# Patient Record
Sex: Male | Born: 1975 | Race: White | Hispanic: No | Marital: Married | Smoking: Never smoker
Health system: Southern US, Community
[De-identification: ages and names within clinical notes are randomized; demographics above are authoritative.]

## PROBLEM LIST (undated history)

## (undated) HISTORY — PX: VASECTOMY: SHX75

---

## 2009-10-13 DEATH — deceased

## 2017-01-31 DIAGNOSIS — S56911A Strain of unspecified muscles, fascia and tendons at forearm level, right arm, initial encounter: Secondary | ICD-10-CM | POA: Diagnosis not present

## 2017-01-31 DIAGNOSIS — I861 Scrotal varices: Secondary | ICD-10-CM | POA: Diagnosis not present

## 2017-08-13 DIAGNOSIS — Z13220 Encounter for screening for lipoid disorders: Secondary | ICD-10-CM | POA: Diagnosis not present

## 2017-08-13 DIAGNOSIS — Z0000 Encounter for general adult medical examination without abnormal findings: Secondary | ICD-10-CM | POA: Diagnosis not present

## 2019-03-20 ENCOUNTER — Ambulatory Visit: Payer: Self-pay | Admitting: Family

## 2019-03-30 ENCOUNTER — Ambulatory Visit: Payer: Self-pay | Admitting: Family

## 2019-04-17 ENCOUNTER — Ambulatory Visit (INDEPENDENT_AMBULATORY_CARE_PROVIDER_SITE_OTHER): Payer: 59 | Admitting: Family

## 2019-04-17 ENCOUNTER — Encounter: Payer: Self-pay | Admitting: Family

## 2019-04-17 ENCOUNTER — Other Ambulatory Visit: Payer: Self-pay

## 2019-04-17 ENCOUNTER — Other Ambulatory Visit: Payer: Self-pay | Admitting: Family

## 2019-04-17 ENCOUNTER — Encounter (INDEPENDENT_AMBULATORY_CARE_PROVIDER_SITE_OTHER): Payer: Self-pay

## 2019-04-17 VITALS — BP 116/72 | HR 59 | Temp 98.0°F | Ht 73.0 in | Wt 201.6 lb

## 2019-04-17 DIAGNOSIS — Z125 Encounter for screening for malignant neoplasm of prostate: Secondary | ICD-10-CM

## 2019-04-17 DIAGNOSIS — M25561 Pain in right knee: Secondary | ICD-10-CM | POA: Diagnosis not present

## 2019-04-17 DIAGNOSIS — Z13220 Encounter for screening for lipoid disorders: Secondary | ICD-10-CM | POA: Diagnosis not present

## 2019-04-17 DIAGNOSIS — Z0000 Encounter for general adult medical examination without abnormal findings: Secondary | ICD-10-CM | POA: Diagnosis not present

## 2019-04-17 DIAGNOSIS — M79672 Pain in left foot: Secondary | ICD-10-CM

## 2019-04-17 LAB — COMPREHENSIVE METABOLIC PANEL
ALT: 17 U/L (ref 0–53)
AST: 16 U/L (ref 0–37)
Albumin: 4.1 g/dL (ref 3.5–5.2)
Alkaline Phosphatase: 61 U/L (ref 39–117)
BUN: 15 mg/dL (ref 6–23)
CO2: 27 mEq/L (ref 19–32)
Calcium: 9.3 mg/dL (ref 8.4–10.5)
Chloride: 104 mEq/L (ref 96–112)
Creatinine, Ser: 0.97 mg/dL (ref 0.40–1.50)
GFR: 83.67 mL/min (ref >60.00)
Glucose, Bld: 74 mg/dL (ref 70–99)
Potassium: 3.9 mEq/L (ref 3.5–5.1)
Sodium: 138 mEq/L (ref 135–145)
Total Bilirubin: 0.7 mg/dL (ref 0.2–1.2)
Total Protein: 6.6 g/dL (ref 6.0–8.3)

## 2019-04-17 LAB — PSA: PSA: 0.36 ng/mL (ref 0.10–4.00)

## 2019-04-17 LAB — CBC WITH DIFFERENTIAL/PLATELET
Basophils Absolute: 0 10*3/uL (ref 0.0–0.1)
Basophils Relative: 0.4 % (ref 0.0–3.0)
Eosinophils Absolute: 0 10*3/uL (ref 0.0–0.7)
Eosinophils Relative: 0.6 % (ref 0.0–5.0)
HCT: 45 % (ref 39.0–52.0)
Hemoglobin: 15.2 g/dL (ref 13.0–17.0)
Lymphocytes Relative: 15.3 % (ref 12.0–46.0)
Lymphs Abs: 1.1 10*3/uL (ref 0.7–4.0)
MCHC: 33.7 g/dL (ref 30.0–36.0)
MCV: 92.3 fl (ref 78.0–100.0)
Monocytes Absolute: 0.5 10*3/uL (ref 0.1–1.0)
Monocytes Relative: 6.7 % (ref 3.0–12.0)
Neutro Abs: 5.7 10*3/uL (ref 1.4–7.7)
Neutrophils Relative %: 77 % (ref 43.0–77.0)
Platelets: 183 10*3/uL (ref 150.0–400.0)
RBC: 4.87 Mil/uL (ref 4.22–5.81)
RDW: 12.6 % (ref 11.5–15.5)
WBC: 7.4 10*3/uL (ref 4.0–10.5)

## 2019-04-17 LAB — LIPID PANEL
Cholesterol: 176 mg/dL (ref 0–200)
HDL: 38.7 mg/dL — ABNORMAL LOW (ref >39.00)
LDL Cholesterol: 117 mg/dL — ABNORMAL HIGH (ref 0–99)
NonHDL: 136.87
Total CHOL/HDL Ratio: 5
Triglycerides: 97 mg/dL (ref 0.0–149.0)
VLDL: 19.4 mg/dL (ref 0.0–40.0)

## 2019-04-17 NOTE — Progress Notes (Signed)
Scott [name] is a 44 y.o. male with the following history as recorded in Epic[employer]:  There are no problems to display for this patient.   No current outpatient medications on file.   No current facility-administered medications for this visit.    Allergies: Patient has no known allergies.  History reviewed. No pertinent past medical history.  Past Surgical History:  Procedure Laterality Date  . VASECTOMY      Family History  Problem Relation Age of Onset  . Hyperlipidemia Father   . Hypertension Father     Social History   Tobacco Use  . Smoking status: Never Smoker  . Smokeless tobacco: Never Used  Substance Use Topics  . Alcohol use: Not on file    Subjective:  Patient presents today as a new patient; 1) Right knee pain "on and off" x 1 year; feels like it has been more problematic since trying to start exercising in the past year;  2) Suspicious for left foot plantar fasciitis;  Needs to get yearly labs/ CPE updated for employer; in baseline state of health today; Does see dentist regularly and overdue to see eye doctor; Sleeping well;  Tdap up to date;  Health Maintenance  Topic Date Due  . HIV Screening  03/19/1989  . TETANUS/TDAP  07/27/2025  . INFLUENZA VACCINE  Completed    Review of Systems  Constitutional: Positive for weight loss.       Planned with increased exercise  HENT: Negative.   Eyes: Negative.   Respiratory: Negative.   Cardiovascular: Negative.   Gastrointestinal: Negative.   Genitourinary: Negative.   Musculoskeletal: Positive for joint pain.  Neurological: Negative.   Endo/Heme/Allergies: Negative.   Psychiatric/Behavioral: Negative.       Objective:  Vitals:   04/17/19 1137  BP: 116/72  Pulse: (!) 59  Temp: 98 F (36.7 C)  TempSrc: Oral  SpO2: 96%  Weight: 201 lb 9.6 oz (91.4 kg)  Height: 6' 1"  (1.854 m)    General: Well developed, well nourished, in no acute distress  Skin : Warm and dry.  Head: Normocephalic and  atraumatic  Eyes: Sclera and conjunctiva clear; pupils round and reactive to light; extraocular movements intact  Ears: External normal; canals clear; tympanic membranes normal  Oropharynx: Pink, supple. No suspicious lesions  Neck: Supple without thyromegaly, adenopathy  Lungs: Respirations unlabored; clear to auscultation bilaterally without wheeze, rales, rhonchi  CVS exam: normal rate and regular rhythm.  Abdomen: Soft; nontender; nondistended; normoactive bowel sounds; no masses or hepatosplenomegaly  Musculoskeletal: No deformities; no active joint inflammation  Extremities: No edema, cyanosis, clubbing  Vessels: Symmetric bilaterally  Neurologic: Alert and oriented; speech intact; face symmetrical; moves all extremities well; CNII-XII intact without focal deficit   Assessment:  1. PE (physical exam), annual   2. Lipid screening   3. Prostate cancer screening     Plan:   Age appropriate preventive healthcare needs addressed; encouraged regular eye doctor and dental exams; encouraged regular exercise and continue working on weight loss goals; will update labs and refills as needed today; follow-up to be determined; Return for follow up with sports medicine.  This visit occurred during the SARS-CoV-2 public health emergency.  Safety protocols were in place, including screening questions prior to the visit, additional usage of staff PPE, and extensive cleaning of exam room while observing appropriate contact time as indicated for disinfecting solutions.       Return for Schedule follow-up with Dr. Tamala Julian or Dr. Georgina Snell for right knee/ left  foot pain.  Orders Placed This Encounter  Procedures  . CBC w/Diff  . Comp Met (CMET)  . Lipid panel  . PSA    Requested Prescriptions    No prescriptions requested or ordered in this encounter

## 2019-04-21 ENCOUNTER — Other Ambulatory Visit: Payer: Self-pay

## 2019-04-21 ENCOUNTER — Ambulatory Visit (INDEPENDENT_AMBULATORY_CARE_PROVIDER_SITE_OTHER): Payer: 59

## 2019-04-21 ENCOUNTER — Encounter: Payer: Self-pay | Admitting: Family Medicine

## 2019-04-21 ENCOUNTER — Ambulatory Visit (INDEPENDENT_AMBULATORY_CARE_PROVIDER_SITE_OTHER): Payer: 59 | Admitting: Family Medicine

## 2019-04-21 ENCOUNTER — Ambulatory Visit: Payer: Self-pay

## 2019-04-21 VITALS — BP 120/78 | HR 66 | Ht 73.0 in | Wt 202.0 lb

## 2019-04-21 DIAGNOSIS — M25561 Pain in right knee: Secondary | ICD-10-CM | POA: Diagnosis not present

## 2019-04-21 DIAGNOSIS — M79672 Pain in left foot: Secondary | ICD-10-CM

## 2019-04-21 MED ORDER — PENNSAID 2 % EX SOLN: 1.0000 "application " | Freq: Two times a day (BID) | CUTANEOUS | 2 refills | Status: DC

## 2019-04-21 NOTE — Progress Notes (Signed)
Subjective:    I'm seeing this patient as a consultation for:  Ria Clock, FNP. Note will be routed back to referring provider/PCP.  CC: R knee and L foot pain  I, Molly Weber, LAT, ATC, am serving as scribe for Dr. Clementeen Graham.  HPI: Pt is a 44 y/o male presenting w/ c/o R knee pain and L foot pain.  R knee pain: started last year when doing HIIT workout  -Radiating pain: pain starts at top of shin and will radiate to lateral knee and top of knee cap -R knee swelling: no -R knee mechanical symptoms: -Aggravating factors: Lunges or squats sometimes walking -Treatments tried: advil  L foot pain: started in July dull sharp pain at bottom of heel  -Radiating pain: will radiate to the back of heel  -Numbness/tingling: none -Aggravating factors: worse in AM -Treatments tried: advil  Past medical history, Surgical history, Family history, Social history, Allergies, and medications have been entered into the medical record, reviewed.   Review of Systems: No new headache, visual changes, nausea, vomiting, diarrhea, constipation, dizziness, abdominal pain, skin rash, fevers, chills, night sweats, weight loss, swollen lymph nodes, body aches, joint swelling, muscle aches, chest pain, shortness of breath, mood changes, visual or auditory hallucinations.   Objective:    Vitals:   04/21/19 1533  BP: 120/78  Pulse: 66  SpO2: 97%   General: Well Developed, well nourished, and in no acute distress.   MSK:  Right knee: Normal-appearing no effusion no deformity. Range of motion 0-120 degrees with crepitation. Not particular tender to palpation. Stable ligamentous exam. Palpable click with medial McMurray's test without pain.  Palpable click mildly at lateral McMurray's test without pain. Intact strength.  Left foot: Normal-appearing Normal foot and ankle motion. Pulses cap refill and sensation are intact distally. Tender palpation plantar calcaneus  Lab and Radiology  Results  Diagnostic Limited MSK Ultrasound of: Right knee Quad tendon normal-appearing.  Slight hyperechoic change at insertion on the patella.  Nontender to palpation with this region with ultrasound probe. Patellar tendon normal. Medial meniscus joint line slightly narrowed no definitive medial meniscus tear. Lateral meniscus: Joint line narrowed.  Hypoechoic fissure through midportion of lateral meniscus consistent with tear. 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  Diagnostic Limited MSK Ultrasound of: Left calcaneus Plantar fascia visible at medial plantar calcaneus. Measuring approximately 0.6 cm. No visible tear. Plantar calcaneal spur present. Impression: Plantar fasciitis with plantar spur  X-ray images obtained today right knee personally and independently reviewed. Mild medial compartment degenerative changes.  No acute fractures. Await for radiology review  Impression and Recommendations:    Assessment and Plan: 44 y.o. male with  Right knee pain: Mild degenerative changes.  Possible meniscus tear. Plan for Voltaren gel or Pennsaid.  Eccentric exercises recheck back if not improving.  Neck step would be injection.  Left plantar calcaneal pain.  Plantar fasciitis.  Eccentric exercises ice massage gel heel cups.  Check back if not improving.  PDMP not reviewed this encounter. Orders Placed This Encounter  Procedures  . [place] LIMITED JOINT SPACE STRUCTURES LOW BILAT(NO LINKED CHARGES)    Order Specific Question:   Reason for Exam (SYMPTOM  OR DIAGNOSIS REQUIRED)    Answer:   eval right knee and left foot pain    Order Specific Question:   Preferred imaging location?    Answer:   [facility]  . DG Knee 4 Views W/Patella Right    Standing Status:   Future  Number of Occurrences:   1    Standing Expiration Date:   06/20/2020    Order Specific Question:   Reason for Exam (SYMPTOM  OR DIAGNOSIS REQUIRED)    Answer:   eval knee pain r     Order Specific Question:   Preferred imaging location?    Answer:   [facility]    Order Specific Question:   Radiology Contrast Protocol - do NOT remove file path    Answer:   [email or web address]   Meds ordered this encounter  Medications  . Diclofenac Sodium (PENNSAID) 2 % SOLN    Sig: Place 1 application onto the skin 2 (two) times daily.    Dispense:  112 g    Refill:  2    Home Phone      [phone] Mobile          [phone]     Discussed warning signs or symptoms. Please see discharge instructions. Patient expresses understanding.   The above documentation has been reviewed and is accurate and complete Lynne Leader

## 2019-04-21 NOTE — Patient Instructions (Addendum)
Thank you for coming in today. We will try voltaren gel OTC or prescription pensaid.  Up to 4x daily for pain Get xray now on the way out.  Use body Helix full knee sleeve during and for 30-60 mins following activity.  Do the heel exercises.  Remember to go down slowly. 30 reps 2-3x daily.  Ice massage in the evening for the foot.     Patellofemoral Pain Syndrome  Patellofemoral pain syndrome is a condition in which the tissue (cartilage) on the underside of the kneecap (patella) softens or breaks down. This causes pain in the front of the knee. The condition is also called runner's knee or chondromalacia patella. Patellofemoral pain syndrome is most common in young adults who are active in sports. The knee is the largest joint in the body. The patella covers the front of the knee and is attached to muscles above and below the knee. The underside of the patella is covered with a smooth type of cartilage (synovium). The smooth surface helps the patella to glide easily when you move your knee. Patellofemoral pain syndrome causes swelling in the joint linings and bone surfaces in the knee. What are the causes? This condition may be caused by:  Overuse of the knee.  Poor alignment of your knee joints.  Weak leg muscles.  A direct blow to your kneecap. What increases the risk? You are more likely to develop this condition if:  You do a lot of activities that can wear down your kneecap. These include: ? Running. ? Squatting. ? Climbing stairs.  You start a new physical activity or exercise program.  You wear shoes that do not fit well.  You do not have good leg strength.  You are overweight. What are the signs or symptoms? The main symptom of this condition is knee pain. This may feel like a dull, aching pain underneath your patella, in the front of your knee. There may be a popping or cracking sound when you move your knee. Pain may get worse with:  Exercise.  Climbing  stairs.  Running.  Jumping.  Squatting.  Kneeling.  Sitting for a long time.  Moving or pushing on your patella. How is this diagnosed? This condition may be diagnosed based on:  Your symptoms and medical history. You may be asked about your recent physical activities and which ones cause knee pain.  A physical exam. This may include: ? Moving your patella back and forth. ? Checking your range of knee motion. ? Having you squat or jump to see if you have pain. ? Checking the strength of your leg muscles.  Imaging tests to confirm the diagnosis. These may include an MRI of your knee. How is this treated? This condition may be treated at home with rest, ice, compression, and elevation (RICE).  Other treatments may include:  Nonsteroidal anti-inflammatory drugs (NSAIDs).  Physical therapy to stretch and strengthen your leg muscles.  Shoe inserts (orthotics) to take stress off your knee.  A knee brace or knee support.  Adhesive tapes to the skin.  Surgery to remove damaged cartilage or move the patella to a better position. This is rare. Follow these instructions at home: If you have a shoe or brace:  Wear the shoe or brace as told by your health care provider. Remove it only as told by your health care provider.  Loosen the shoe or brace if your toes tingle, become numb, or turn cold and blue.  Keep the shoe or brace  clean.  If the shoe or brace is not waterproof: ? Do not let it get wet. ? Cover it with a watertight covering when you take a bath or a shower. Managing pain, stiffness, and swelling  If directed, put ice on the painful area. ? If you have a removable shoe or brace, remove it as told by your health care provider. ? Put ice in a plastic bag. ? Place a towel between your skin and the bag. ? Leave the ice on for 20 minutes, 2-3 times a day.  Move your toes often to avoid stiffness and to lessen swelling.  Rest your knee: ? Avoid activities that  cause knee pain. ? When sitting or lying down, raise (elevate) the injured area above the level of your heart, whenever possible. General instructions  Take over-the-counter and prescription medicines only as told by your health care provider.  Use splints, braces, knee supports, or walking aids as directed by your health care provider.  Perform stretching and strengthening exercises as told by your health care provider or physical therapist.  Do not use any products that contain nicotine or tobacco, such as cigarettes and e-cigarettes. These can delay healing. If you need help quitting, ask your health care provider.  Return to your normal activities as told by your health care provider. Ask your health care provider what activities are safe for you.  Keep all follow-up visits as told by your health care provider. This is important. Contact a health care provider if:  Your symptoms get worse.  You are not improving with home care. Summary  Patellofemoral pain syndrome is a condition in which the tissue (cartilage) on the underside of the kneecap (patella) softens or breaks down.  This condition causes swelling in the joint linings and bone surfaces in the knee. This leads to pain in the front of the knee.  This condition may be treated at home with rest, ice, compression, and elevation (RICE).  Use splints, braces, knee supports, or walking aids as directed by your health care provider. This information is not intended to replace advice given to you by your health care provider. Make sure you discuss any questions you have with your health care provider. Document Revised: 03/11/2017 Document Reviewed: 03/11/2017 Elsevier Patient Education  2020 [employer]    Plantar Fasciitis  Plantar fasciitis is a painful foot condition that affects the heel. It occurs when the band of tissue that connects the toes to the heel bone (plantar fascia) becomes irritated. This can happen as the  result of exercising too much or doing other repetitive activities (overuse injury). The pain from plantar fasciitis can range from mild irritation to severe pain that makes it difficult to walk or move. The pain is usually worse in the morning after sleeping, or after sitting or lying down for a while. Pain may also be worse after long periods of walking or standing. What are the causes? This condition may be caused by:  Standing for long periods of time.  Wearing shoes that do not have good arch support.  Doing activities that put stress on joints (high-impact activities), including running, aerobics, and ballet.  Being overweight.  An abnormal way of walking (gait).  Tight muscles in the back of your lower leg (calf).  High arches in your feet.  Starting a new athletic activity. What are the signs or symptoms? The main symptom of this condition is heel pain. Pain may:  Be worse with first steps after a  time of rest, especially in the morning after sleeping or after you have been sitting or lying down for a while.  Be worse after long periods of standing still.  Decrease after 30-45 minutes of activity, such as gentle walking. How is this diagnosed? This condition may be diagnosed based on your medical history and your symptoms. Your health care provider may ask questions about your activity level. Your health care provider will do a physical exam to check for:  A tender area on the bottom of your foot.  A high arch in your foot.  Pain when you move your foot.  Difficulty moving your foot. You may have imaging tests to confirm the diagnosis, such as:  X-rays.  Ultrasound.  MRI. How is this treated? Treatment for plantar fasciitis depends on how severe your condition is. Treatment may include:  Rest, ice, applying pressure (compression), and raising the affected foot (elevation). This may be called RICE therapy. Your health care provider may recommend RICE therapy  along with over-the-counter pain medicines to manage your pain.  Exercises to stretch your calves and your plantar fascia.  A splint that holds your foot in a stretched, upward position while you sleep (night splint).  Physical therapy to relieve symptoms and prevent problems in the future.  Injections of steroid medicine (cortisone) to relieve pain and inflammation.  Stimulating your plantar fascia with electrical impulses (extracorporeal shock wave therapy). This is usually the last treatment option before surgery.  Surgery, if other treatments have not worked after 12 months. Follow these instructions at home:  Managing pain, stiffness, and swelling  If directed, put ice on the painful area: ? Put ice in a plastic bag, or use a frozen bottle of water. ? Place a towel between your skin and the bag or bottle. ? Roll the bottom of your foot over the bag or bottle. ? Do this for 20 minutes, 2-3 times a day.  Wear athletic shoes that have air-sole or gel-sole cushions, or try wearing soft shoe inserts that are designed for plantar fasciitis.  Raise (elevate) your foot above the level of your heart while you are sitting or lying down. Activity  Avoid activities that cause pain. Ask your health care provider what activities are safe for you.  Do physical therapy exercises and stretches as told by your health care provider.  Try activities and forms of exercise that are easier on your joints (low-impact). Examples include swimming, water aerobics, and biking. General instructions  Take over-the-counter and prescription medicines only as told by your health care provider.  Wear a night splint while sleeping, if told by your health care provider. Loosen the splint if your toes tingle, become numb, or turn cold and blue.  Maintain a healthy weight, or work with your health care provider to lose weight as needed.  Keep all follow-up visits as told by your health care provider. This is  important. Contact a health care provider if you:  Have symptoms that do not go away after caring for yourself at home.  Have pain that gets worse.  Have pain that affects your ability to move or do your daily activities. Summary  Plantar fasciitis is a painful foot condition that affects the heel. It occurs when the band of tissue that connects the toes to the heel bone (plantar fascia) becomes irritated.  The main symptom of this condition is heel pain that may be worse after exercising too much or standing still for a long time.  Treatment varies, but it usually starts with rest, ice, compression, and elevation (RICE therapy) and over-the-counter medicines to manage pain. This information is not intended to replace advice given to you by your health care provider. Make sure you discuss any questions you have with your health care provider. Document Revised: 01/11/2017 Document Reviewed: 11/26/2016 Elsevier Patient Education  2020 [employer].

## 2019-04-22 NOTE — Progress Notes (Signed)
Knee x-ray normal-appearing per radiology

## 2019-06-04 ENCOUNTER — Encounter: Payer: Self-pay | Admitting: Family Medicine

## 2019-06-04 ENCOUNTER — Ambulatory Visit (INDEPENDENT_AMBULATORY_CARE_PROVIDER_SITE_OTHER): Payer: 59 | Admitting: Family Medicine

## 2019-06-04 ENCOUNTER — Other Ambulatory Visit: Payer: Self-pay

## 2019-06-04 VITALS — BP 94/64 | HR 68 | Ht 73.0 in | Wt 193.4 lb

## 2019-06-04 DIAGNOSIS — M79672 Pain in left foot: Secondary | ICD-10-CM

## 2019-06-04 DIAGNOSIS — M722 Plantar fascial fibromatosis: Secondary | ICD-10-CM

## 2019-06-04 DIAGNOSIS — M25561 Pain in right knee: Secondary | ICD-10-CM | POA: Diagnosis not present

## 2019-06-04 NOTE — Progress Notes (Signed)
   I, Christoper Fabian, LAT, ATC, am serving as scribe for Dr. Clementeen Graham.  Scott [name] is a 44 y.o. male who presents to [facility] today for f/u of R knee and L heel pain.  He was last seen by Dr. Denyse Amass on 04/21/19 and was provided w/ a HEP focusing and quad and calf eccentrics.  He was also advised to purchase a knee compression sleeve.  Since his last visit, pt reports that both his R knee and L heel are feeling better.  He has been doing his HEP.  He is wearing gel heel cups.  He locates his L heel pain to his L posterior heel.  He has purchased a R knee compression sleeve.  Diagnostic imaging: R knee XR- 04/21/19  Pertinent review of systems: No fevers or chills  Relevant historical information: No significant medical problems   Exam:  BP 94/64 (BP Location: Left Arm, Patient Position: Sitting, Cuff Size: Normal)   Pulse 68   Ht 6\' 1"  (1.854 m)   Wt 193 lb 6.4 oz (87.7 kg)   SpO2 97%   BMI 25.52 kg/m  General: Well Developed, well nourished, and in no acute distress.   MSK: Right knee normal-appearing normal motion. Left foot: Normal motion.    Assessment and Plan: 44 y.o. male with Right knee pain: Improved with compression and home exercise program.  Pain thought to be DJD versus degenerative meniscus tear.  Significant provement still having a little bit of symptoms.  Discussed return to exercise strategies.  Recommend quad strengthening exercises that avoid excessive knee bend including cycling/spin.  Avoids deep squats or lunges.  Single-leg pistol squats to 70 degrees knee flexion should be okay.  Recheck as needed.  Next step if needed injection versus MRI.  Left plantar heel pain: Plantar fasciitis.  Again improved with eccentric exercises gel heel cups and icing.  Still having a bit of residual pain.  Discussed the natural history of plantar fasciitis is that it may linger for several months even with proper treatment.  If needed could  return to clinic would consider injection and imaging if needed however I think he probably will do just fine.    Discussed warning signs or symptoms. Please see discharge instructions. Patient expresses understanding.   The above documentation has been reviewed and is accurate and complete 54

## 2019-06-04 NOTE — Patient Instructions (Signed)
Thank you for coming in today.  Continue current treatment.  OK to do more if needed.  Voltaren gel can be helpful. Use up to 4x daily.   Injection vs MRI for right knee.   Keep up the exercise for the foot.   Avoid lunges.  Single leg squats to 70 deg knee bend should be ok for knee pain.  Cycling/Spin is great for knees usually.

## 2019-12-17 NOTE — Progress Notes (Signed)
Scott [name] is a 44 y.o. male who presents to [facility] at [facility] today for L shoulder pain.  He was last seen by Dr. Denyse Amass on 06/04/19 for R knee and L foot pain.  Since then, pt reports L shoulder pain x approximately one month w/ no known MOI.  He locates his pain to his L post scap, ant chest and lateral upper arm.  He has recently started doing more weight training over the last 4 months.  Neck pain: intermittently yes L shoulder mechanical symptoms: mainly in his L scap and clavicle L UE numbness/tingling: yes in his L thumb and intermittently into the rest of his L fingers Radiating pain: yes from post scap into L lateral shoulder and L lateral upper arm Aggravating factors: chest press/bench press Treatments tried: Voltaren gel x 1 time; heat   Pertinent review of systems: No fevers or chills  Relevant historical information: Healthy otherwise.  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  He has a good ergonomic work set up.  He has been increasing weight lifting over the last several months.   Exam:  BP 100/70 (BP Location: Left Arm, Patient Position: Sitting, Cuff Size: Large)    Pulse 68    Ht 6\' 1"  (1.854 m)    Wt 192 lb 3.2 oz (87.2 kg)    SpO2 97%    BMI 25.36 kg/m  General: Well Developed, well nourished, and in no acute distress.   MSK: C-spine normal-appearing nontender midline.  Tender palpation left trapezius. Normal cervical motion pain with left rotation and left lateral flexion.  Mildly positive left-sided Spurling's test. Upper semistrength reflexes and sensation are equal and normal throughout bilateral upper extremities. Left shoulder normal-appearing nontender normal motion normal strength negative Hawkins and Neer's test negative empty can test negative Yergason's and speeds test.    Lab and Radiology Results  X-ray images C-spine obtained today personally and independently interpreted. Mild DDD C4-C5.  Loss of cervical lordosis  otherwise x-ray cervical spine normal no acute fractures or malformation. Await formal radiology review    Assessment and Plan: 44 y.o. male with left trapezius pain with some symptoms extending to the thumb and index finger.  Thought to be cervical spasm and dysfunction.  May have a component of cervical radiculopathy at C6 or C7.  Plan for physical therapy.  Will prescribe prednisone as a backup if worsening and prescribe gabapentin for trial especially in the evening if needed.  Recheck in 6 weeks or so.  Return sooner if needed.  Would consider MRI if worsening.   PDMP not reviewed this encounter. Orders Placed This Encounter  Procedures   DG Cervical Spine 2 or 3 views    Standing Status:   Future    Number of Occurrences:   1    Standing Expiration Date:   12/17/2020    Order Specific Question:   Reason for Exam (SYMPTOM  OR DIAGNOSIS REQUIRED)    Answer:   eval pain cspine and poss left c6 or c7 radiculopathy    Order Specific Question:   Preferred imaging location?    Answer:   13/06/2020   Ambulatory referral to Physical Therapy    Referral Priority:   Routine    Referral Type:   Physical Medicine    Referral Reason:   Specialty Services Required    Requested Specialty:   Physical Therapy    Number of Visits Requested:   1   Meds  ordered this encounter  Medications   predniSONE (DELTASONE) 50 MG tablet    Sig: Take 1 tablet (50 mg total) by mouth daily.    Dispense:  5 tablet    Refill:  0   gabapentin (NEURONTIN) 300 MG capsule    Sig: Take 1 capsule (300 mg total) by mouth 3 (three) times daily as needed.    Dispense:  90 capsule    Refill:  3     Discussed warning signs or symptoms. Please see discharge instructions. Patient expresses understanding.   The above documentation has been reviewed and is accurate and complete Clementeen Graham, M.D.

## 2019-12-18 ENCOUNTER — Ambulatory Visit: Payer: Self-pay

## 2019-12-18 ENCOUNTER — Other Ambulatory Visit: Payer: Self-pay

## 2019-12-18 ENCOUNTER — Ambulatory Visit (INDEPENDENT_AMBULATORY_CARE_PROVIDER_SITE_OTHER): Payer: 59 | Admitting: Family Medicine

## 2019-12-18 ENCOUNTER — Encounter: Payer: Self-pay | Admitting: Family Medicine

## 2019-12-18 ENCOUNTER — Ambulatory Visit (INDEPENDENT_AMBULATORY_CARE_PROVIDER_SITE_OTHER): Payer: 59

## 2019-12-18 VITALS — BP 100/70 | HR 68 | Ht 73.0 in | Wt 192.2 lb

## 2019-12-18 DIAGNOSIS — M25512 Pain in left shoulder: Secondary | ICD-10-CM | POA: Diagnosis not present

## 2019-12-18 DIAGNOSIS — M5412 Radiculopathy, cervical region: Secondary | ICD-10-CM

## 2019-12-18 DIAGNOSIS — M62838 Other muscle spasm: Secondary | ICD-10-CM | POA: Diagnosis not present

## 2019-12-18 MED ORDER — PREDNISONE 50 MG PO TABS: 50.0000 mg | ORAL_TABLET | Freq: Every day | ORAL | 0 refills | Status: DC

## 2019-12-18 MED ORDER — GABAPENTIN 300 MG PO CAPS: 300.0000 mg | ORAL_CAPSULE | Freq: Three times a day (TID) | ORAL | 3 refills | Status: DC | PRN

## 2019-12-18 NOTE — Patient Instructions (Signed)
Thank you for coming in today.  I've referred you to Physical Therapy.  Let us know if you don't hear from them in one week.  Please get an Xray today before you leave  Take the prednisone if bad.   Try the gabapentin mostly at bedtime as needed for nerve pain.   Cervical Radiculopathy  Cervical radiculopathy happens when a nerve in the neck (a cervical nerve) is pinched or bruised. This condition can happen because of an injury to the cervical spine (vertebrae) in the neck, or as part of the normal aging process. Pressure on the cervical nerves can cause pain or numbness that travels from the neck all the way down into the arm and fingers. Usually, this condition gets better with rest. Treatment may be needed if the condition does not improve. What are the causes? This condition may be caused by:  A neck injury.  A bulging (herniated) disk.  Muscle spasms.  Muscle tightness in the neck because of overuse.  Arthritis.  Breakdown or degeneration in the bones and joints of the spine (spondylosis) due to aging.  Bone spurs that may develop near the cervical nerves. What are the signs or symptoms? Symptoms of this condition include:  Pain. The pain may travel from the neck to the arm and hand. The pain can be severe or irritating. It may be worse when you move your neck.  Numbness or tingling in your arm or hand.  Weakness in the affected arm and hand, in severe cases. How is this diagnosed? This condition may be diagnosed based on your symptoms, your medical history, and a physical exam. You may also have tests, including:  X-rays.  A CT scan.  An MRI.  An electromyogram (EMG).  Nerve conduction tests. How is this treated? In many cases, treatment is not needed for this condition. With rest, the condition usually gets better over time. If treatment is needed, options may include:  Wearing a soft neck collar (cervical collar) for short periods of time, as told by your  health care provider.  Doing physical therapy to strengthen your neck muscles.  Taking medicines, such as NSAIDs or oral corticosteroids.  Having spinal injections, in severe cases.  Having surgery. This may be needed if other treatments do not help. Different types of surgery may be done depending on the cause of this condition. Follow these instructions at home: If you have a cervical collar:  Wear it as told by your health care provider. Remove it only as told by your health care provider.  Ask your health care provider if you can remove the collar for cleaning and bathing. If you are allowed to remove the collar for cleaning or bathing: ? Follow instructions from your health care provider about how to remove the collar safely. ? Clean the collar by wiping it with mild soap and water and drying it completely. ? Take out any removable pads in the collar every 1-2 days, and wash them by hand with soap and water. Let them air-dry completely before you put them back in the collar. ? Check your skin under the collar for irritation or sores. If you see any, tell your health care provider. Managing pain      Take over-the-counter and prescription medicines only as told by your health care provider.  If directed, put ice on the affected area. ? If you have a soft neck collar, remove it as told by your health care provider. ? Put ice in a  plastic bag. ? Place a towel between your skin and the bag. ? Leave the ice on for 20 minutes, 2-3 times a day.  If applying ice does not help, you can try using heat. Use the heat source that your health care provider recommends, such as a moist heat pack or a heating pad. ? Place a towel between your skin and the heat source. ? Leave the heat on for 20-30 minutes. ? Remove the heat if your skin turns bright red. This is especially important if you are unable to feel pain, heat, or cold. You may have a greater risk of getting burned.  Try a gentle  neck and shoulder massage to help relieve symptoms. Activity  Rest as needed.  Return to your normal activities as told by your health care provider. Ask your health care provider what activities are safe for you.  Do stretching and strengthening exercises as told by your health care provider or physical therapist.  Do not lift anything that is heavier than 10 lb (4.5 kg) until your health care provider tells you that it is safe. General instructions  Use a flat pillow when you sleep.  Do not drive while wearing a cervical collar. If you do not have a cervical collar, ask your health care provider if it is safe to drive while your neck heals.  Ask your health care provider if the medicine prescribed to you requires you to avoid driving or using heavy machinery.  Do not use any products that contain nicotine or tobacco, such as cigarettes, e-cigarettes, and chewing tobacco. These can delay healing. If you need help quitting, ask your health care provider.  Keep all follow-up visits as told by your health care provider. This is important. Contact a health care provider if:  Your condition does not improve with treatment. Get help right away if:  Your pain gets much worse and cannot be controlled with medicines.  You have weakness or numbness in your hand, arm, face, or leg.  You have a high fever.  You have a stiff, rigid neck.  You lose control of your bowels or your bladder (have incontinence).  You have trouble with walking, balance, or speaking. Summary  Cervical radiculopathy happens when a nerve in the neck is pinched or bruised.  A nerve can get pinched from a bulging disk, arthritis, muscle spasms, or an injury to the neck.  Symptoms include pain, tingling, or numbness radiating from the neck into the arm or hand. Weakness can also occur in severe cases.  Treatment may include rest, wearing a cervical collar, and physical therapy. Medicines may be prescribed to help  with pain. In severe cases, injections or surgery may be needed. This information is not intended to replace advice given to you by your health care provider. Make sure you discuss any questions you have with your health care provider. Document Revised: 12/20/2017 Document Reviewed: 12/20/2017 Elsevier Patient Education  2020 [employer].

## 2019-12-21 NOTE — Progress Notes (Signed)
X-ray cervical spine shows no arthritis or fractures.

## 2022-03-06 IMAGING — DX DG CERVICAL SPINE 2 OR 3 VIEWS
3 series · 3 of 3 positions shown · non-contrast
Comparison: None.

CLINICAL DATA: 44-year-old male with neck pain. Possible left C6-C7
radiculopathy. No known injury.

EXAM:
CERVICAL SPINE - 2-3 VIEW

[c-spine lat]
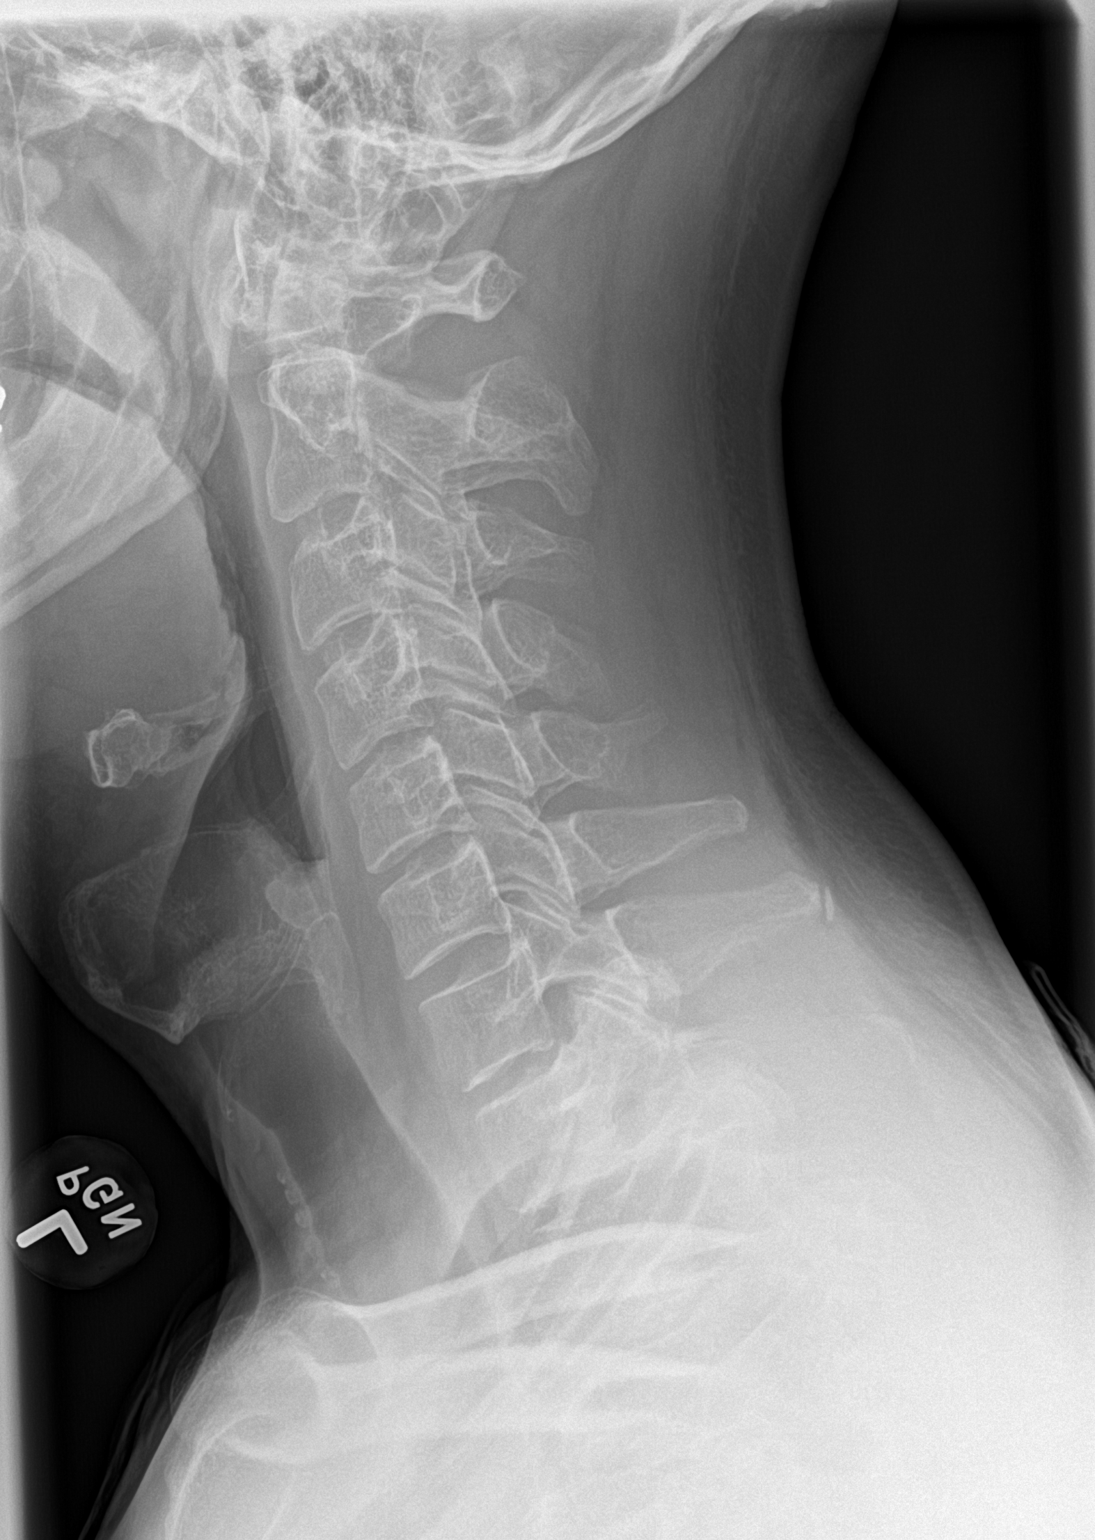

[c-spine ap]
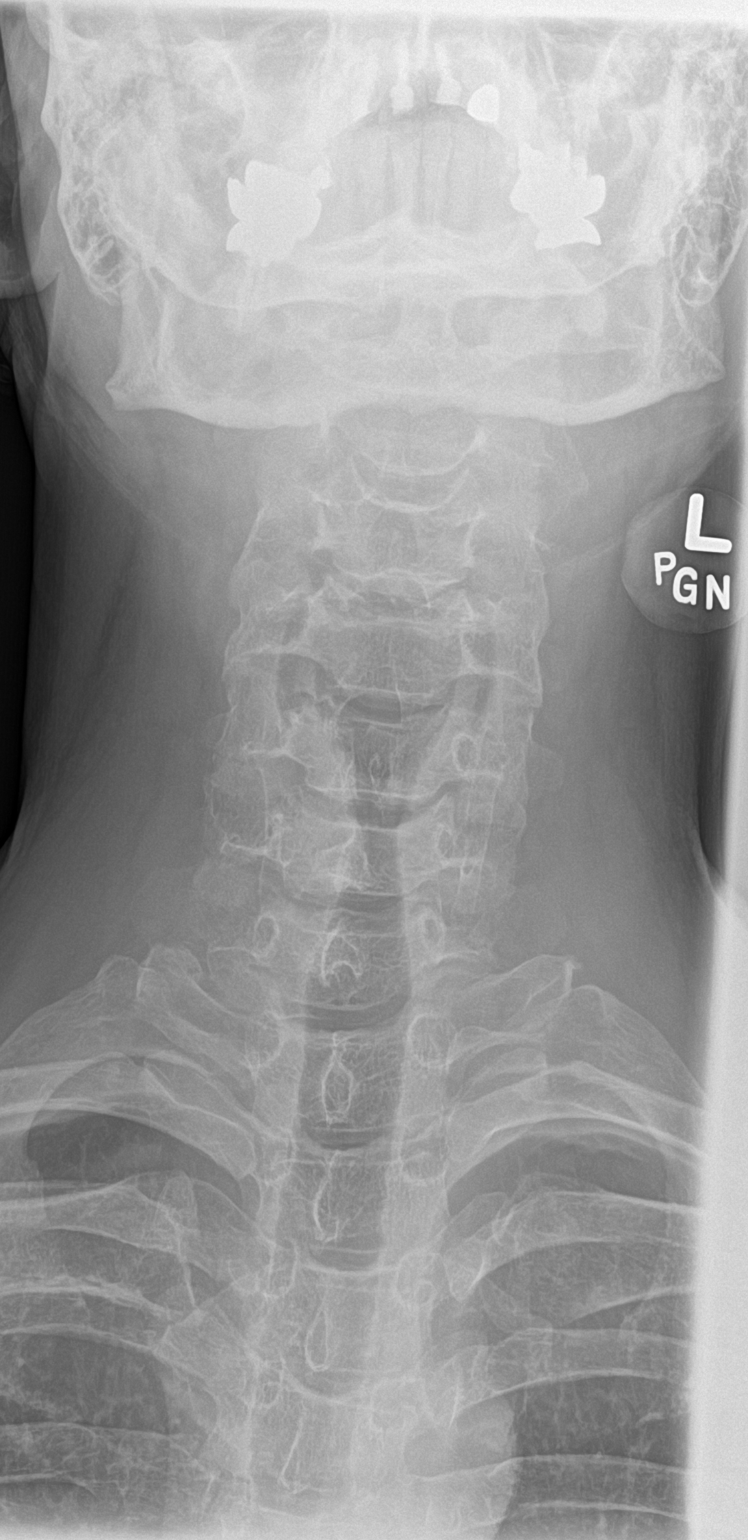

[c-spine open mouth]
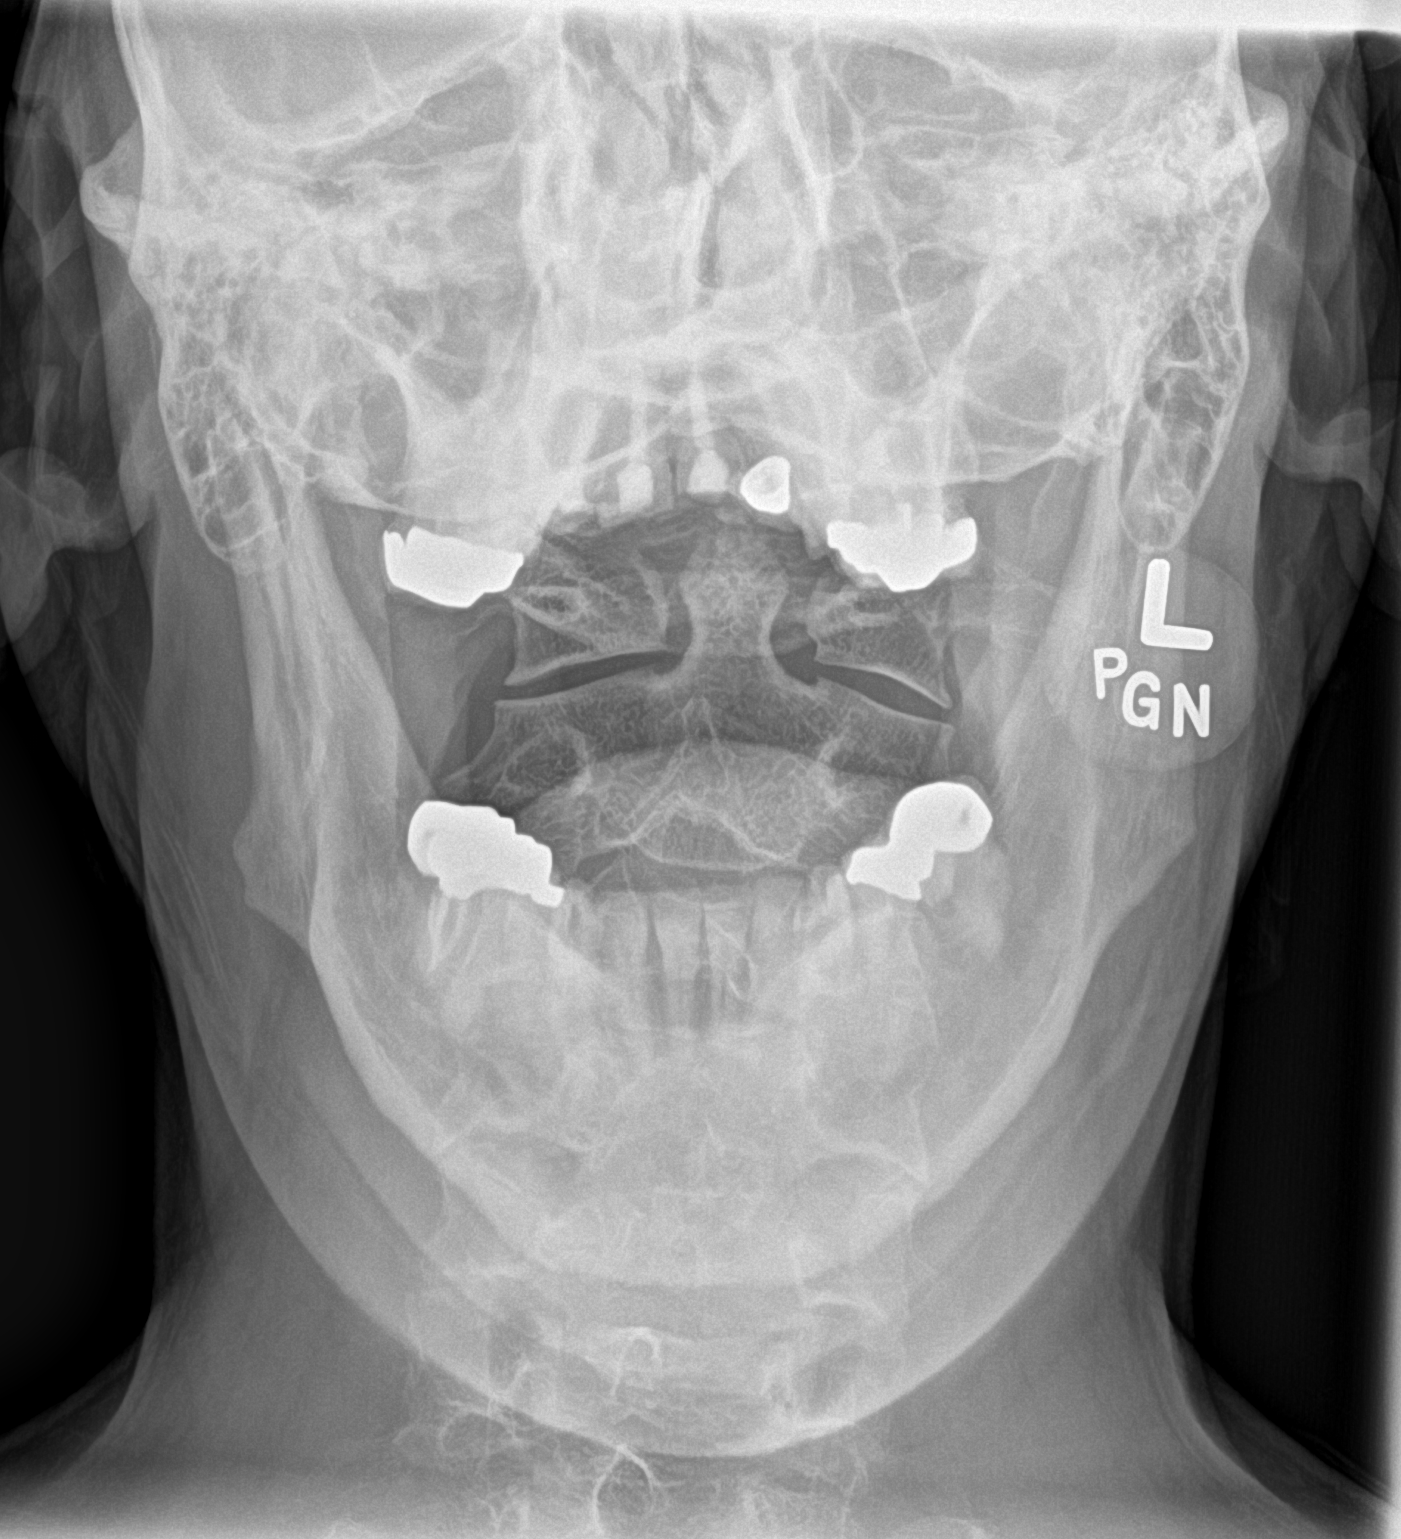

[3 of 3 positions shown; findings below may reference images not displayed]

FINDINGS: There is no acute fracture or subluxation of the cervical spine. The
vertebral body heights and disc spaces are maintained. The
visualized posterior elements and odontoid appear intact. There is
anatomic alignment of the lateral masses of C1 and C2. The soft
tissues are unremarkable.
IMPRESSION: Negative cervical spine radiographs.

## 2022-07-27 ENCOUNTER — Ambulatory Visit: Payer: 59 | Admitting: Family Medicine

## 2022-09-19 ENCOUNTER — Ambulatory Visit (INDEPENDENT_AMBULATORY_CARE_PROVIDER_SITE_OTHER): Payer: 59 | Admitting: Family Medicine

## 2022-09-19 ENCOUNTER — Encounter: Payer: Self-pay | Admitting: Family Medicine

## 2022-09-19 VITALS — BP 110/78 | HR 60 | Temp 97.6°F | Ht 73.0 in | Wt 205.0 lb

## 2022-09-19 DIAGNOSIS — Z7689 Persons encountering health services in other specified circumstances: Secondary | ICD-10-CM | POA: Diagnosis not present

## 2022-09-19 DIAGNOSIS — L282 Other prurigo: Secondary | ICD-10-CM | POA: Diagnosis not present

## 2022-09-19 DIAGNOSIS — R000 Tachycardia, unspecified: Secondary | ICD-10-CM | POA: Diagnosis not present

## 2022-09-19 MED ORDER — TRIAMCINOLONE ACETONIDE 0.025 % EX OINT: 1.0000 | TOPICAL_OINTMENT | Freq: Two times a day (BID) | CUTANEOUS | 0 refills | Status: DC

## 2022-09-19 MED ORDER — MUPIROCIN 2 % EX OINT: 1.0000 | TOPICAL_OINTMENT | Freq: Two times a day (BID) | CUTANEOUS | 0 refills | Status: DC

## 2022-09-19 NOTE — Assessment & Plan Note (Signed)
No red flag symptoms. HR 170 with intense exercise. Discussed recovery rate of 15-18 bpm. He will monitor. Follow up for labs and fasting CPE

## 2022-09-19 NOTE — Assessment & Plan Note (Signed)
?  folliculitis. Treat with Bactroban. Triamcinolone also prescribed.  Follow up with dermatologist if not improving in the next 2-4 weeks.

## 2022-09-19 NOTE — Progress Notes (Signed)
New Patient Office Visit  Subjective    Patient ID: Scott [name], male    DOB: Jun 04, 1975  Age: 47 y.o. MRN: [id number]  CC:  Chief Complaint  Patient presents with   Establish Care    Needs physical for work, no PCP in years    HPI Scott [name] presents to establish care  C/o 5-6 month hx of pruritic rash on his lower legs. Has not used anything.   States his heart rate jumps up to 170 with exercise and takes 5 minutes or longer sometimes to get back to normal. Feels that his pulse is taking longer to recover than most people he exercises with. Ongoing x 2 years at least. Not worsening.   Feels dizzy at times. No syncope in 2 years.    He has been taking salt tablets since he does not get much sodium in diet.   No chest pain, palpitations, orthopnea, PND, DOE, edema.   3-6 cups of caffeine daily.   No family hx of heart disease.   Works as an Games developer Medications as of 09/19/2022  Medication Sig   mupirocin ointment (BACTROBAN) 2 % Apply 1 Application topically 2 (two) times daily.   triamcinolone (KENALOG) 0.025 % ointment Apply 1 Application topically 2 (two) times daily.   [DISCONTINUED] gabapentin (NEURONTIN) 300 MG capsule Take 1 capsule (300 mg total) by mouth 3 (three) times daily as needed.   [DISCONTINUED] predniSONE (DELTASONE) 50 MG tablet Take 1 tablet (50 mg total) by mouth daily.   No facility-administered encounter medications on file as of 09/19/2022.    History reviewed. No pertinent past medical history.  Past Surgical History:  Procedure Laterality Date   VASECTOMY      Family History  Problem Relation Age of Onset   Hyperlipidemia Father    Hypertension Father     Social History   Socioeconomic History   Marital status: Married    Spouse name: Not on file   Number of children: Not on file   Years of education: Not on file   Highest education level: Not on file  Occupational History   Not on  file  Tobacco Use   Smoking status: Never   Smokeless tobacco: Never  Substance and Sexual Activity   Alcohol use: Yes    Comment: 2 per month   Drug use: Never   Sexual activity: Not on file  Other Topics Concern   Not on file  Social History Narrative   Not on file   Social Determinants of Health   Financial Resource Strain: Not on file  Food Insecurity: Not on file  Transportation Needs: Not on file  Physical Activity: Not on file  Stress: Not on file  Social Connections: Not on file  Intimate Partner Violence: Not on file    Review of Systems  Constitutional:  Negative for chills, fever, malaise/fatigue and weight loss.  Respiratory:  Negative for shortness of breath.   Cardiovascular:  Negative for chest pain, palpitations, orthopnea, leg swelling and PND.  Gastrointestinal:  Negative for abdominal pain, constipation, diarrhea, nausea and vomiting.  Genitourinary:  Negative for dysuria, frequency and urgency.  Neurological:  Negative for dizziness, sensory change, focal weakness and headaches.        Objective    BP 110/78 (BP Location: Left Arm, Patient Position: Sitting, Cuff Size: Large)   Pulse 60   Temp 97.6 F (36.4 C) (Temporal)   Ht 6\' 1"  (1.854  m)   Wt 205 lb (93 kg)   SpO2 99%   BMI 27.05 kg/m   Physical Exam Constitutional:      General: He is not in acute distress.    Appearance: He is not ill-appearing.  Eyes:     Extraocular Movements: Extraocular movements intact.     Conjunctiva/sclera: Conjunctivae normal.  Cardiovascular:     Rate and Rhythm: Normal rate and regular rhythm.     Pulses: Normal pulses.  Pulmonary:     Effort: Pulmonary effort is normal.     Breath sounds: Normal breath sounds.  Musculoskeletal:     Cervical back: Normal range of motion and neck supple.     Right lower leg: No edema.     Left lower leg: No edema.  Skin:    General: Skin is warm and dry.     Findings: Rash present.          Comments: Small  areas of erythema on bilateral medial lower legs, worse on left. No induration, fluctuance or drainage.   Neurological:     General: No focal deficit present.     Mental Status: He is alert and oriented to person, place, and time.  Psychiatric:        Mood and Affect: Mood normal.        Behavior: Behavior normal.        Thought Content: Thought content normal.         Assessment & Plan:   Problem List Items Addressed This Visit       Musculoskeletal and Integument   Pruritic rash - Primary    ?folliculitis. Treat with Bactroban. Triamcinolone also prescribed.  Follow up with dermatologist if not improving in the next 2-4 weeks.       Relevant Medications   triamcinolone (KENALOG) 0.025 % ointment   mupirocin ointment (BACTROBAN) 2 %     Other   Exercise-induced tachycardia    No red flag symptoms. HR 170 with intense exercise. Discussed recovery rate of 15-18 bpm. He will monitor. Follow up for labs and fasting CPE      Other Visit Diagnoses     Encounter to establish care           Return for fasting CPE.   Hetty Blend, NP-C

## 2022-09-19 NOTE — Patient Instructions (Signed)
Use the antibiotic (mupirocin/Bactroban) ointment twice daily for the next 2 weeks.  Use the steroid (triamcinolone) ointment twice daily for the next 1 to 2 weeks.  Schedule with your dermatologist in case symptoms are not resolving.  A good recovery heart rate after exercise is 15 to 18 bpm.  I recommend following up in the next 2 to 4 weeks for a fasting (nothing eat or drink except water for at least 6 to 8 hours) complete physical exam and labs.

## 2022-10-03 ENCOUNTER — Encounter: Payer: Self-pay | Admitting: Family Medicine

## 2022-10-03 ENCOUNTER — Ambulatory Visit (INDEPENDENT_AMBULATORY_CARE_PROVIDER_SITE_OTHER): Payer: 59 | Admitting: Family Medicine

## 2022-10-03 VITALS — BP 106/78 | HR 58 | Temp 97.3°F | Ht 73.0 in | Wt 208.0 lb

## 2022-10-03 DIAGNOSIS — Z0000 Encounter for general adult medical examination without abnormal findings: Secondary | ICD-10-CM

## 2022-10-03 DIAGNOSIS — Z1159 Encounter for screening for other viral diseases: Secondary | ICD-10-CM | POA: Diagnosis not present

## 2022-10-03 DIAGNOSIS — Z0001 Encounter for general adult medical examination with abnormal findings: Secondary | ICD-10-CM

## 2022-10-03 DIAGNOSIS — R000 Tachycardia, unspecified: Secondary | ICD-10-CM

## 2022-10-03 DIAGNOSIS — Z13220 Encounter for screening for lipoid disorders: Secondary | ICD-10-CM

## 2022-10-03 DIAGNOSIS — R55 Syncope and collapse: Secondary | ICD-10-CM

## 2022-10-03 DIAGNOSIS — Z131 Encounter for screening for diabetes mellitus: Secondary | ICD-10-CM | POA: Diagnosis not present

## 2022-10-03 DIAGNOSIS — E663 Overweight: Secondary | ICD-10-CM

## 2022-10-03 DIAGNOSIS — Z125 Encounter for screening for malignant neoplasm of prostate: Secondary | ICD-10-CM | POA: Diagnosis not present

## 2022-10-03 DIAGNOSIS — L282 Other prurigo: Secondary | ICD-10-CM

## 2022-10-03 DIAGNOSIS — Z1211 Encounter for screening for malignant neoplasm of colon: Secondary | ICD-10-CM | POA: Insufficient documentation

## 2022-10-03 HISTORY — DX: Encounter for screening for other viral diseases: Z11.59

## 2022-10-03 LAB — CBC WITH DIFFERENTIAL/PLATELET
Basophils Absolute: 0 10*3/uL (ref 0.0–0.1)
Basophils Relative: 0.4 % (ref 0.0–3.0)
Eosinophils Absolute: 0.1 10*3/uL (ref 0.0–0.7)
Eosinophils Relative: 1.3 % (ref 0.0–5.0)
HCT: 45.6 % (ref 39.0–52.0)
Hemoglobin: 15.1 g/dL (ref 13.0–17.0)
Lymphocytes Relative: 25.9 % (ref 12.0–46.0)
Lymphs Abs: 1.3 10*3/uL (ref 0.7–4.0)
MCHC: 33.2 g/dL (ref 30.0–36.0)
MCV: 91.2 fl (ref 78.0–100.0)
Monocytes Absolute: 0.4 10*3/uL (ref 0.1–1.0)
Monocytes Relative: 8.8 % (ref 3.0–12.0)
Neutro Abs: 3.2 10*3/uL (ref 1.4–7.7)
Neutrophils Relative %: 63.6 % (ref 43.0–77.0)
Platelets: 190 10*3/uL (ref 150.0–400.0)
RBC: 5 Mil/uL (ref 4.22–5.81)
RDW: 13.1 % (ref 11.5–15.5)
WBC: 5.1 10*3/uL (ref 4.0–10.5)

## 2022-10-03 LAB — COMPREHENSIVE METABOLIC PANEL
ALT: 19 U/L (ref 0–53)
AST: 18 U/L (ref 0–37)
Albumin: 4.3 g/dL (ref 3.5–5.2)
Alkaline Phosphatase: 82 U/L (ref 39–117)
BUN: 20 mg/dL (ref 6–23)
CO2: 29 meq/L (ref 19–32)
Calcium: 9.2 mg/dL (ref 8.4–10.5)
Chloride: 104 meq/L (ref 96–112)
Creatinine, Ser: 1.22 mg/dL (ref 0.40–1.50)
GFR: 70.09 mL/min (ref >60.00)
Glucose, Bld: 100 mg/dL — ABNORMAL HIGH (ref 70–99)
Potassium: 4.4 meq/L (ref 3.5–5.1)
Sodium: 138 meq/L (ref 135–145)
Total Bilirubin: 0.7 mg/dL (ref 0.2–1.2)
Total Protein: 6.9 g/dL (ref 6.0–8.3)

## 2022-10-03 LAB — LIPID PANEL
Cholesterol: 235 mg/dL — ABNORMAL HIGH (ref 0–200)
HDL: 36.3 mg/dL — ABNORMAL LOW (ref >39.00)
LDL Cholesterol: 170 mg/dL — ABNORMAL HIGH (ref 0–99)
NonHDL: 198.62
Total CHOL/HDL Ratio: 6
Triglycerides: 143 mg/dL (ref 0.0–149.0)
VLDL: 28.6 mg/dL (ref 0.0–40.0)

## 2022-10-03 LAB — HEPATITIS C ANTIBODY: Hepatitis C Ab: NONREACTIVE

## 2022-10-03 LAB — HEMOGLOBIN A1C: Hgb A1c MFr Bld: 5.3 % (ref 4.6–6.5)

## 2022-10-03 LAB — HIV ANTIBODY (ROUTINE TESTING W REFLEX): HIV 1&2 Ab, 4th Generation: NONREACTIVE

## 2022-10-03 NOTE — Assessment & Plan Note (Signed)
Average risk for colon cancer. Prefers Cologuard at this time. Discussed possible false positive and negative risks.

## 2022-10-03 NOTE — Addendum Note (Signed)
Addended by: Sumner Boast on: 10/03/2022 10:38 AM   Modules accepted: Orders

## 2022-10-03 NOTE — Assessment & Plan Note (Signed)
Preventive health care reviewed.  Counseling on healthy lifestyle including diet and exercise.  Recommend regular dental and eye exams.  Immunizations reviewed.  Discussed safety.  

## 2022-10-03 NOTE — Progress Notes (Signed)
Complete physical exam  Patient: Scott [name]   DOB: [birth year]   47 y.o. Male  MRN: [id number]  Subjective:    Chief Complaint  Patient presents with   Annual Exam    fasting   He is here for a complete physical exam.  Syncope in the past with strenuous exercise. Dizziness with strenuous exercise if his head drops below his heart. Heart rate takes several minutes to recover to normal.  No chest pain   Never had colon cancer screening. Denies family hx of colon cancer. No  He prefers to do a Cologuard     Health Maintenance  Topic Date Due   HIV Screening  Never done   Hepatitis C Screening  Never done   Colon Cancer Screening  Never done   Flu Shot  09/13/2022   COVID-19 Vaccine (1 - 2023-24 season) 10/05/2022*   DTaP/Tdap/Td vaccine (2 - Td or Tdap) 07/27/2025   HPV Vaccine  Aged Out  *Topic was postponed. The date shown is not the original due date.    Wears seatbelt always, uses sunscreen, smoke detectors in home and functioning, does not text while driving, feels safe in home environment.  Depression screening:    10/03/2022    8:46 AM 09/19/2022   11:21 AM 04/17/2019   11:57 AM  Depression screen PHQ 2/9  Decreased Interest 0 0 0  Down, Depressed, Hopeless 0 0 0  PHQ - 2 Score 0 0 0   Anxiety Screening:     No data to display          Vision:Not within last year , Dental: No current dental problems and Receives regular dental care, STD: The patient denies history of sexually transmitted disease., and PSA: Prostate cancer screening and PSA options (with potential risks and benefits of testing vs. not testing) were discussed along with recent recs/guidelines.   Patient Active Problem List   Diagnosis Date Noted   Encounter for screening for other viral diseases 10/03/2022   Screen for colon cancer 10/03/2022   Encounter for general adult medical examination with abnormal findings 10/03/2022   Pruritic rash 09/19/2022   Exercise-induced tachycardia  09/19/2022   Past Medical History:  Diagnosis Date   Encounter for screening for other viral diseases 10/03/2022   Past Surgical History:  Procedure Laterality Date   VASECTOMY     Social History   Tobacco Use   Smoking status: Never   Smokeless tobacco: Never  Substance Use Topics   Alcohol use: Yes    Alcohol/week: 1.0 standard drink of alcohol    Types: 1 Standard drinks or equivalent per week    Comment: 2 per month   Drug use: Never      Patient Care Team: Avanell Shackleton, NP-C as PCP - General (Family Medicine)   Outpatient Medications Prior to Visit  Medication Sig   mupirocin ointment (BACTROBAN) 2 % Apply 1 Application topically 2 (two) times daily.   triamcinolone (KENALOG) 0.025 % ointment Apply 1 Application topically 2 (two) times daily.   No facility-administered medications prior to visit.    Review of Systems  Constitutional:  Negative for chills and fever.  HENT:  Negative for congestion, ear pain and sinus pain.   Eyes:  Negative for blurred vision, double vision and pain.  Respiratory:  Negative for cough, shortness of breath and wheezing.   Cardiovascular:  Negative for chest pain, palpitations and leg swelling.  Gastrointestinal:  Negative for abdominal pain, constipation, diarrhea, nausea  and vomiting.  Genitourinary:  Negative for dysuria, frequency and urgency.  Musculoskeletal:  Negative for back pain, joint pain and myalgias.  Skin:  Positive for rash.  Neurological:  Negative for dizziness, tingling, focal weakness and headaches.  Endo/Heme/Allergies:  Does not bruise/bleed easily.  Psychiatric/Behavioral:  Negative for depression and suicidal ideas. The patient is not nervous/anxious.        Objective:    BP 106/78 (BP Location: Left Arm, Patient Position: Sitting, Cuff Size: Large)   Pulse (!) 58   Temp (!) 97.3 F (36.3 C) (Temporal)   Ht 6\' 1"  (1.854 m)   Wt 208 lb (94.3 kg)   SpO2 99%   BMI 27.44 kg/m  BP Readings from Last  3 Encounters:  10/03/22 106/78  09/19/22 110/78  12/18/19 100/70   Wt Readings from Last 3 Encounters:  10/03/22 208 lb (94.3 kg)  09/19/22 205 lb (93 kg)  12/18/19 192 lb 3.2 oz (87.2 kg)    Physical Exam Constitutional:      General: He is not in acute distress.    Appearance: He is not ill-appearing.  HENT:     Right Ear: Tympanic membrane, ear canal and external ear normal.     Left Ear: Tympanic membrane, ear canal and external ear normal.     Nose: Nose normal.     Mouth/Throat:     Mouth: Mucous membranes are moist.     Pharynx: Oropharynx is clear.  Eyes:     Extraocular Movements: Extraocular movements intact.     Conjunctiva/sclera: Conjunctivae normal.     Pupils: Pupils are equal, round, and reactive to light.  Neck:     Thyroid: No thyroid mass, thyromegaly or thyroid tenderness.  Cardiovascular:     Rate and Rhythm: Normal rate and regular rhythm.     Pulses: Normal pulses.     Heart sounds: Normal heart sounds.  Pulmonary:     Effort: Pulmonary effort is normal.     Breath sounds: Normal breath sounds.  Abdominal:     General: Bowel sounds are normal. There is no distension.     Palpations: Abdomen is soft.     Tenderness: There is no abdominal tenderness. There is no right CVA tenderness, left CVA tenderness, guarding or rebound.  Musculoskeletal:        General: Normal range of motion.     Cervical back: Normal range of motion and neck supple. No tenderness.     Right lower leg: No edema.     Left lower leg: No edema.  Lymphadenopathy:     Cervical: No cervical adenopathy.  Skin:    General: Skin is warm and dry.     Findings: No lesion or rash.  Neurological:     General: No focal deficit present.     Mental Status: He is alert and oriented to person, place, and time.     Cranial Nerves: No cranial nerve deficit.     Sensory: No sensory deficit.     Motor: No weakness.  Psychiatric:        Mood and Affect: Mood normal.        Behavior:  Behavior normal.        Thought Content: Thought content normal.      No results found for any visits on 10/03/22.    Assessment & Plan:    Routine Health Maintenance and Physical Exam  Problem List Items Addressed This Visit       Musculoskeletal and Integument  Pruritic rash    Improving. Discussed avoiding his legs touching regularly. Consider OTC antifungal. He will also see dermatologist as scheduled.         Other   Encounter for general adult medical examination with abnormal findings - Primary    Preventive health care reviewed.  Counseling on healthy lifestyle including diet and exercise.  Recommend regular dental and eye exams.  Immunizations reviewed.  Discussed safety.       Encounter for screening for other viral diseases   Relevant Orders   Hepatitis C antibody   HIV Antibody (routine testing w rflx)   Exercise-induced tachycardia    No red flag symptoms. HR 170s with intense exercise. He reports taking 5-8 minutes for his pulse to return to normal which is longer than his wife and exercise partners. Discussed recovery rate of 15-18 bpm. He has been monitoring. Hx of syncope 2 years ago with intense exercise and near syncope if his head drops below his heart with exercise. Referral to cardiologist for evaluation.       Relevant Orders   CBC with Differential/Platelet   Comprehensive metabolic panel   TSH   T4, free   Ambulatory referral to Cardiology   Screen for colon cancer    Average risk for colon cancer. Prefers Cologuard at this time. Discussed possible false positive and negative risks.       Relevant Orders   Cologuard   Other Visit Diagnoses     Screening for prostate cancer       Relevant Orders   PSA   Near syncope       Relevant Orders   Ambulatory referral to Cardiology   Screening for lipid disorders       Relevant Orders   Lipid panel       Return in about 1 year (around 10/03/2023).     Hetty Blend, NP-C

## 2022-10-03 NOTE — Assessment & Plan Note (Signed)
Improving. Discussed avoiding his legs touching regularly. Consider OTC antifungal. He will also see dermatologist as scheduled.

## 2022-10-03 NOTE — Assessment & Plan Note (Signed)
No red flag symptoms. HR 170s with intense exercise. He reports taking 5-8 minutes for his pulse to return to normal which is longer than his wife and exercise partners. Discussed recovery rate of 15-18 bpm. He has been monitoring. Hx of syncope 2 years ago with intense exercise and near syncope if his head drops below his heart with exercise. Referral to cardiologist for evaluation.

## 2022-10-03 NOTE — Patient Instructions (Addendum)
Lamisil over the counter. Try to avoid your lower legs touching.   You will hear from [facility]  We will be in touch with your lab results and with recommendations.

## 2022-10-05 LAB — TSH: TSH: 2.23 u[IU]/mL (ref 0.35–5.50)

## 2022-10-05 LAB — PSA: PSA: 1.46 ng/mL (ref 0.10–4.00)

## 2022-10-05 LAB — T4, FREE: Free T4: 1.05 ng/dL (ref 0.60–1.60)

## 2022-10-09 NOTE — Progress Notes (Signed)
Schedule follow up visit with me in 6 weeks. I would like to recheck PSA at that time.

## 2022-10-10 ENCOUNTER — Other Ambulatory Visit: Payer: Self-pay | Admitting: Family Medicine

## 2022-10-10 ENCOUNTER — Other Ambulatory Visit: Payer: Self-pay

## 2022-10-10 DIAGNOSIS — R972 Elevated prostate specific antigen [PSA]: Secondary | ICD-10-CM

## 2022-10-19 ENCOUNTER — Ambulatory Visit: Payer: 59

## 2022-10-19 VITALS — BP 110/80 | HR 62 | Ht 73.0 in | Wt 208.0 lb

## 2022-10-19 DIAGNOSIS — E782 Mixed hyperlipidemia: Secondary | ICD-10-CM

## 2022-10-19 DIAGNOSIS — R000 Tachycardia, unspecified: Secondary | ICD-10-CM | POA: Diagnosis not present

## 2022-10-19 HISTORY — DX: Mixed hyperlipidemia: E78.2

## 2022-10-19 NOTE — Patient Instructions (Addendum)
Medication Instructions:  Your physician recommends that you continue on your current medications as directed. Please refer to the Current Medication list given to you today.  *If you need a refill on your cardiac medications before your next appointment, please call your pharmacy*   Lab Work: None If you have labs (blood work) drawn today and your tests are completely normal, you will receive your results only by: MyChart Message (if you have MyChart) OR A paper copy in the mail If you have any lab test that is abnormal or we need to change your treatment, we will call you to review the results.   Testing/Procedures: Your physician has requested that you have an echocardiogram. Echocardiography is a painless test that uses sound waves to create images of your heart. It provides your doctor with information about the size and shape of your heart and how well your heart's chambers and valves are working. This procedure takes approximately one hour. There are no restrictions for this procedure. Please do NOT wear cologne, perfume, aftershave, or lotions (deodorant is allowed). Please arrive 15 minutes prior to your appointment time.   Treadmill stress test Take all medications except beta blockers.  No food or drink or tobacco products for 2 hour prior to test.  Dress prepared to exercise.   Follow-Up: At [facility], you and your health needs are our priority.  As part of our continuing mission to provide you with exceptional heart care, we have created designated Provider Care Teams.  These Care Teams include your primary Cardiologist (physician) and Advanced Practice Providers (APPs -  Physician Assistants and Nurse Practitioners) who all work together to provide you with the care you need, when you need it.  We recommend signing up for the patient portal called "MyChart".  Sign up information is provided on this After Visit Summary.  MyChart is used to connect with patients  for Virtual Visits (Telemedicine).  Patients are able to view lab/test results, encounter notes, upcoming appointments, etc.  Non-urgent messages can be sent to your provider as well.   To learn more about what you can do with MyChart, go to [email or web address].    Your next appointment:   Follow up to be determined after testing completed  Provider:   Huntley Dec, MD    Other Instructions None

## 2022-10-19 NOTE — Assessment & Plan Note (Signed)
Recent lipid panel numbers elevated LDL 170, HDL 36, total cholesterol 161, triglycerides 143.Marland Kitchen In comparison his prior lipid panel numbers from 2021 were better. Likely related to dietary modification recently.  His 10-year ASCVD risk score is 4%.  Advised regular exercise and dietary modification and reassess lipid panel in 3 months.  Follow-up with his PCP.

## 2022-10-19 NOTE — Assessment & Plan Note (Signed)
Tachycardia associated with exercise and reduce heart rate variability. Discussed various potential causes, mostly benign issues such as inadequate cardiac conditioning, inadequate hydration around the time of exercise. With baseline heart rate at rest being in 50s, unlikely to have significant autonomic dysfunction.  Advised adequate hydration preceding the exercise and through the exercise if possible. Advised increase cardiac training with 2-3 sessions a week. Advised about cardiac conditioning with training exercises which help improve heart rate variability.  Will rule out any significant structural issues with a transthoracic echocardiogram and objectively assess his heart rate variability on a exercise treadmill stress test.

## 2022-10-19 NOTE — Progress Notes (Signed)
Cardiology Consultation:    Date:  10/19/2022   ID:  Alberteen Spindle, DOB 1975-11-23, MRN [id number]  PCP:  Avanell Shackleton, NP-C  Cardiologist:  Luretha Murphy, MD   Referring MD: Avanell Shackleton, NP-C   Chief Complaint  Patient presents with   Tachycardia     ASSESSMENT AND PLAN:   Mr. Palermo 47 year old male patient with no significant medical history.  Here for evaluation of heart rates tend to run high and takes extended time to slow down after exercise activity.  Postural lightheadedness at times, following exercise.  Problem List Items Addressed This Visit     Tachycardia - Primary    Tachycardia associated with exercise and reduce heart rate variability. Discussed various potential causes, mostly benign issues such as inadequate cardiac conditioning, inadequate hydration around the time of exercise. With baseline heart rate at rest being in 50s, unlikely to have significant autonomic dysfunction.  Advised adequate hydration preceding the exercise and through the exercise if possible. Advised increase cardiac training with 2-3 sessions a week. Advised about cardiac conditioning with training exercises which help improve heart rate variability.  Will rule out any significant structural issues with a transthoracic echocardiogram and objectively assess his heart rate variability on a exercise treadmill stress test.        Relevant Orders   EKG 12-Lead   Exercise Tolerance Test   ECHOCARDIOGRAM COMPLETE   Mixed hyperlipidemia    Recent lipid panel numbers elevated LDL 170, HDL 36, total cholesterol 629, triglycerides 143.Marland Kitchen In comparison his prior lipid panel numbers from 2021 were better. Likely related to dietary modification recently.  His 10-year ASCVD risk score is 4%.  Advised regular exercise and dietary modification and reassess lipid panel in 3 months.  Follow-up with his PCP.      Return to clinic based on test results from echocardiogram  and treadmill stress.   History of Present Illness:    TRINIDAD WICKER is a 47 y.o. male who is being seen today for the evaluation of exercise-induced tachycardia at the request of Suezanne Jacquet, Vickie L, NP-C. No significant past medical history.  Here for the visit accompanied by his wife.  Works as an Airline pilot, consumes alcohol on rare social occasions.  No smoking.  Does exercise routinely mostly involving weight training.  Once a week he takes spin class on a stationary bike.  Does not have any additional cardiac training.  Recently adjusted his diet for higher protein intake with weight training.  Typically exercises in the afternoon or evenings.  He has noticed his heart rates tending to run high in upper 170s, taking extended time to slow down.  Mentions baseline resting heart rate per his smart watch tend to be in 50s.  Does notice tendency to get lightheaded on bending down and standing up while his heart rate remains up.  Has not had any syncopal episode but does tend to get lightheaded.  EKG today in the clinic shows sinus rhythm heart rate 62/min, PR interval 174 ms, QRS duration 108 ms with RSR morphology in V1.  The 10-year ASCVD risk score (Arnett DK, et al., 2019) is: 4%   Values used to calculate the score:     Age: 68 years     Sex: Male     Is Non-Hispanic African American: No     Diabetic: No     Tobacco smoker: No     Systolic Blood Pressure: 110 mmHg     Is BP  treated: No     HDL Cholesterol: 36.3 mg/dL     Total Cholesterol: 235 mg/dL  Past Medical History:  Diagnosis Date   Encounter for screening for other viral diseases 10/03/2022   Mixed hyperlipidemia 10/19/2022    Past Surgical History:  Procedure Laterality Date   VASECTOMY      Current Medications: No outpatient medications have been marked as taking for the 10/19/22 encounter (Office Visit) with Donnovan Stamour, Marlyn Corporal, MD.     Allergies:   Patient has no known allergies.   Social History    Socioeconomic History   Marital status: Married    Spouse name: Not on file   Number of children: Not on file   Years of education: Not on file   Highest education level: Master's degree (e.g., MA, MS, MEng, MEd, MSW, MBA)  Occupational History   Not on file  Tobacco Use   Smoking status: Never   Smokeless tobacco: Never  Substance and Sexual Activity   Alcohol use: Yes    Alcohol/week: 1.0 standard drink of alcohol    Types: 1 Standard drinks or equivalent per week    Comment: 2 per month   Drug use: Never   Sexual activity: Yes    Partners: Female    Birth control/protection: Surgical  Other Topics Concern   Not on file  Social History Narrative   Not on file   Social Determinants of Health   Financial Resource Strain: Low Risk  (10/02/2022)   Overall Financial Resource Strain (CARDIA)    Difficulty of Paying Living Expenses: Not hard at all  Food Insecurity: No Food Insecurity (10/02/2022)   Hunger Vital Sign    Worried About Running Out of Food in the Last Year: Never true    Ran Out of Food in the Last Year: Never true  Transportation Needs: No Transportation Needs (10/02/2022)   PRAPARE - Administrator, Civil Service (Medical): No    Lack of Transportation (Non-Medical): No  Physical Activity: Sufficiently Active (10/02/2022)   Exercise Vital Sign    Days of Exercise per Week: 3 days    Minutes of Exercise per Session: 50 min  Stress: No Stress Concern Present (10/02/2022)   [employer] of Occupational Health - Occupational Stress Questionnaire    Feeling of Stress : Only a little  Social Connections: Socially Isolated (10/02/2022)   Social Connection and Isolation Panel [NHANES]    Frequency of Communication with Friends and Family: Once a week    Frequency of Social Gatherings with Friends and Family: Once a week    Attends Religious Services: Never    Database administrator or Organizations: No    Attends Engineer, structural: Not  on file    Marital Status: Married     Family History: The patient's family history includes Hyperlipidemia in his father; Hypertension in his father. ROS:   Please see the history of present illness.    All 14 point review of systems negative except as described per history of present illness.  EKGs/Labs/Other Studies Reviewed:    The following studies were reviewed today:   EKG:       Recent Labs: 10/03/2022: ALT 19; BUN 20; Creatinine, Ser 1.22; Hemoglobin 15.1; Platelets 190.0; Potassium 4.4; Sodium 138; TSH 2.23  Recent Lipid Panel    Component Value Date/Time   CHOL 235 (H) 10/03/2022 0932   TRIG 143.0 10/03/2022 0932   HDL 36.30 (L) 10/03/2022 0932   CHOLHDL 6  10/03/2022 0932   VLDL 28.6 10/03/2022 0932   LDLCALC 170 (H) 10/03/2022 0932  Recent lab work for TSH was normal from 10-03-2022   Physical Exam:    VS:  BP 110/80 (BP Location: Left Arm, Patient Position: Sitting, Cuff Size: Normal)   Pulse 62   Ht 6\' 1"  (1.854 m)   Wt 208 lb (94.3 kg)   SpO2 98%   BMI 27.44 kg/m     Wt Readings from Last 3 Encounters:  10/19/22 208 lb (94.3 kg)  10/03/22 208 lb (94.3 kg)  09/19/22 205 lb (93 kg)     GENERAL:  Well nourished, well developed in no acute distress NECK: No JVD; No carotid bruits CARDIAC: RRR, S1 and S2 present, no murmurs, no rubs, no gallops CHEST:  Clear to auscultation without rales, wheezing or rhonchi  Extremities: No pitting pedal edema. Pulses bilaterally symmetric with radial 2+ and dorsalis pedis 2+ NEUROLOGIC:  Alert and oriented x 3  Medication Adjustments/Labs and Tests Ordered: Current medicines are reviewed at length with the patient today.  Concerns regarding medicines are outlined above.  Orders Placed This Encounter  Procedures   Exercise Tolerance Test   EKG 12-Lead   ECHOCARDIOGRAM COMPLETE   No orders of the defined types were placed in this encounter.   Signed, Cecille Amsterdam, MD, MPH, [facility]. 10/19/2022 8:45 AM     [facility]

## 2022-10-24 LAB — COLOGUARD: COLOGUARD: NEGATIVE

## 2022-10-30 ENCOUNTER — Encounter (HOSPITAL_COMMUNITY): Payer: Self-pay | Admitting: *Deleted

## 2022-10-30 ENCOUNTER — Telehealth (HOSPITAL_COMMUNITY): Payer: Self-pay | Admitting: *Deleted

## 2022-10-30 NOTE — Telephone Encounter (Signed)
Letter with instructions for upcoming GXT sent to my chart.  Scott [name]

## 2022-11-07 ENCOUNTER — Ambulatory Visit (HOSPITAL_BASED_OUTPATIENT_CLINIC_OR_DEPARTMENT_OTHER): Payer: 59

## 2022-11-07 ENCOUNTER — Ambulatory Visit (HOSPITAL_COMMUNITY): Payer: 59

## 2022-11-07 DIAGNOSIS — R000 Tachycardia, unspecified: Secondary | ICD-10-CM | POA: Insufficient documentation

## 2022-11-07 DIAGNOSIS — E782 Mixed hyperlipidemia: Secondary | ICD-10-CM | POA: Diagnosis present

## 2022-11-07 DIAGNOSIS — R0609 Other forms of dyspnea: Secondary | ICD-10-CM | POA: Diagnosis not present

## 2022-11-07 LAB — EXERCISE TOLERANCE TEST
Angina Index: 0
Duke Treadmill Score: 13
Estimated workload: 15.5
Exercise duration (min): 13 min
Exercise duration (sec): 10 s
MPHR: 172 {beats}/min
Peak HR: 176 {beats}/min
Percent HR: 102 %
RPE: 18
Rest HR: 58 {beats}/min
ST Depression (mm): 0 mm

## 2022-11-07 LAB — ECHOCARDIOGRAM COMPLETE
Area-P 1/2: 3.28 cm2
S' Lateral: 3.2 cm

## 2022-11-21 ENCOUNTER — Other Ambulatory Visit (INDEPENDENT_AMBULATORY_CARE_PROVIDER_SITE_OTHER): Payer: 59

## 2022-11-21 DIAGNOSIS — R972 Elevated prostate specific antigen [PSA]: Secondary | ICD-10-CM | POA: Diagnosis not present

## 2022-11-21 LAB — PSA: PSA: 1.43 ng/mL (ref 0.10–4.00)

## 2022-11-22 LAB — PSA, TOTAL AND FREE
PSA, % Free: 14 % — ABNORMAL LOW (ref >25)
PSA, Free: 0.2 ng/mL
PSA, Total: 1.4 ng/mL (ref <=4.0)

## 2024-02-21 ENCOUNTER — Ambulatory Visit: Admitting: Family Medicine

## 2024-02-21 ENCOUNTER — Encounter: Payer: Self-pay | Admitting: Family Medicine

## 2024-02-21 VITALS — BP 118/80 | HR 78 | Temp 97.6°F | Ht 73.0 in | Wt 210.0 lb

## 2024-02-21 DIAGNOSIS — E782 Mixed hyperlipidemia: Secondary | ICD-10-CM | POA: Diagnosis not present

## 2024-02-21 DIAGNOSIS — Z0000 Encounter for general adult medical examination without abnormal findings: Secondary | ICD-10-CM

## 2024-02-21 DIAGNOSIS — R140 Abdominal distension (gaseous): Secondary | ICD-10-CM | POA: Diagnosis not present

## 2024-02-21 DIAGNOSIS — Z0001 Encounter for general adult medical examination with abnormal findings: Secondary | ICD-10-CM

## 2024-02-21 DIAGNOSIS — K638219 Small intestinal bacterial overgrowth, unspecified: Secondary | ICD-10-CM

## 2024-02-21 DIAGNOSIS — Z7185 Encounter for immunization safety counseling: Secondary | ICD-10-CM

## 2024-02-21 NOTE — Progress Notes (Signed)
 "  Complete physical exam  Patient: Scott [name]   DOB: March 02, 1975   49 y.o. Male  MRN: [id number]  Subjective:    Chief Complaint  Patient presents with   Annual Exam    Recently saw functional medicine dr and wanted her to look over the tests that were done and see if he needs abx   He is here for a complete physical exam.  He recently had a visit with a functional medicine physician and brought in lab results and a breath test from that visit for my review.    Discussed the use of AI scribe software for clinical note transcription with the patient, who gave verbal consent to proceed.  History of Present Illness  as well asBenjamin J Keiton Cosma is a 49 year old male who presents for his annual preventive healthcare visit and physical exam.  Gastrointestinal symptoms - Chronic gas and bloating with generalized abdominal pain - No other significant gastrointestinal symptoms beyond gas, bloating, and mild stomach pain - Blood work and SIBO breath test in November  - Results suggested possible intestinal overgrowth in which antibiotic therapy may be warranted - Requests referral to GI  Sleep disturbance - Significant sleep disturbance with awakening in the middle of the night in a panic - Often unable to return to sleep for approximately two hours - Cortisol saliva test showed elevated cortisol at night and low levels on waking and throughout the day - Working on sleep hygiene as part of wellness routine  Orthostatic symptoms - Lightheadedness when standing - Evaluated by cardiology last year with normal findings  Laboratory findings - Recent labs showed normal cholesterol, CBC, metabolic panel, liver function, electrolytes, triglycerides, HDL, thyroid function, B12, folate, uric acid, C-reactive protein, PSA, testosterone, A1c, and vitamin D - Ferritin was initially high but normalized on repeat testing  Preventive health measures - Received influenza vaccination -  Has not received COVID vaccine due to prior adverse reactions - Considering hepatitis B and shingles vaccination as he approaches age 88 - Increasing omega-3 intake and exercise as part of wellness routine    Health Maintenance  Topic Date Due   Hepatitis B Vaccine (1 of 3 - 19+ 3-dose series) Never done   Colon Cancer Screening  Never done   COVID-19 Vaccine (4 - 2025-26 season) 03/08/2024*   DTaP/Tdap/Td vaccine (2 - Td or Tdap) 07/27/2025   Flu Shot  Completed   Hepatitis C Screening  Completed   HIV Screening  Completed   Pneumococcal Vaccine  Aged Out   HPV Vaccine  Aged Out   Meningitis B Vaccine  Aged Out  *Topic was postponed. The date shown is not the original due date.    Depression screening:    02/21/2024    3:55 PM 10/03/2022    8:46 AM 09/19/2022   11:21 AM  Depression screen PHQ 2/9  Decreased Interest 0 0 0  Down, Depressed, Hopeless 0 0 0  PHQ - 2 Score 0 0 0   Anxiety Screening:     No data to display           Patient Care Team: Lendia Boby CROME, NP-C as PCP - General (Family Medicine)   Show/hide medication list[1]  Review of Systems  Constitutional:  Negative for chills and fever.  HENT:  Negative for congestion, ear pain, sinus pain and sore throat.   Eyes:  Negative for blurred vision, double vision and pain.  Respiratory:  Negative for cough, shortness of  breath and wheezing.   Cardiovascular:  Negative for chest pain, palpitations and leg swelling.  Gastrointestinal:  Positive for abdominal pain. Negative for constipation, diarrhea, nausea and vomiting.  Genitourinary:  Negative for dysuria, frequency and urgency.  Musculoskeletal:  Negative for back pain, joint pain and myalgias.  Skin:  Negative for rash.  Neurological:  Positive for dizziness. Negative for tingling, focal weakness and headaches.  Psychiatric/Behavioral:  Negative for depression and suicidal ideas. The patient is not nervous/anxious.        Sleep disturbances        Objective:    BP 118/80   Pulse 78   Temp 97.6 F (36.4 C) (Temporal)   Ht 6' 1 (1.854 m)   Wt 210 lb (95.3 kg)   SpO2 99%   BMI 27.71 kg/m  BP Readings from Last 3 Encounters:  02/21/24 118/80  10/19/22 110/80  10/03/22 106/78   Wt Readings from Last 3 Encounters:  02/21/24 210 lb (95.3 kg)  10/19/22 208 lb (94.3 kg)  10/03/22 208 lb (94.3 kg)    Physical Exam Exam conducted with a chaperone present.  Constitutional:      General: He is not in acute distress.    Appearance: He is not ill-appearing.  HENT:     Right Ear: Tympanic membrane, ear canal and external ear normal.     Left Ear: Tympanic membrane, ear canal and external ear normal.     Nose: Nose normal.     Mouth/Throat:     Mouth: Mucous membranes are moist.     Pharynx: Oropharynx is clear.  Eyes:     Extraocular Movements: Extraocular movements intact.     Conjunctiva/sclera: Conjunctivae normal.     Pupils: Pupils are equal, round, and reactive to light.  Neck:     Thyroid: No thyroid mass, thyromegaly or thyroid tenderness.  Cardiovascular:     Rate and Rhythm: Normal rate and regular rhythm.     Pulses: Normal pulses.     Heart sounds: Normal heart sounds.  Pulmonary:     Effort: Pulmonary effort is normal.     Breath sounds: Normal breath sounds.  Abdominal:     General: Bowel sounds are normal. There is no distension.     Palpations: Abdomen is soft.     Tenderness: There is no abdominal tenderness. There is no guarding or rebound.     Hernia: There is no hernia in the left inguinal area or right inguinal area.  Genitourinary:    Penis: Normal.      Testes: Normal.     Epididymis:     Right: Normal.     Left: Normal.  Musculoskeletal:        General: Normal range of motion.     Cervical back: Normal range of motion and neck supple. No tenderness.     Right lower leg: No edema.     Left lower leg: No edema.  Lymphadenopathy:     Cervical: No cervical adenopathy.     Lower Body: No  right inguinal adenopathy. No left inguinal adenopathy.  Skin:    General: Skin is warm and dry.     Findings: No lesion or rash.  Neurological:     General: No focal deficit present.     Mental Status: He is alert and oriented to person, place, and time.     Cranial Nerves: No cranial nerve deficit.     Sensory: No sensory deficit.     Motor: No weakness.  Psychiatric:        Mood and Affect: Mood normal.        Behavior: Behavior normal.        Thought Content: Thought content normal.      No results found for any visits on 02/21/24.    Assessment & Plan:    Routine Health Maintenance and Physical Exam Problem List Items Addressed This Visit     Encounter for general adult medical examination with abnormal findings - Primary   Mixed hyperlipidemia    Assessment and Plan Assessment & Plan Mixed hyperlipidemia LDL cholesterol is elevated at 161 mg/dL, with total cholesterol at 223 mg/dL. HDL is within a good range. Triglycerides are normal. Recent labs from November show normal liver function, electrolytes, and thyroid function. No immediate need for rechecking cholesterol levels as recent labs are satisfactory. - Continue current management and lifestyle modifications.  Small intestinal bacterial overgrowth (SIBO) Reports chronic gas and bloating. 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 Symptoms include bloating, gas, and generalized stomach pain. Cortisol levels show a spike at night and depression upon waking, correlating with sleep disturbances. Previous cardiology evaluation was normal. Current management includes sleep hygiene, multivitamins, increased omega-3 intake, and exercise.  - Referred to [facility] for further evaluation and management of IMO. - Continue current lifestyle modifications including sleep hygiene, multivitamins, increased omega-3 intake, and exercise.  General Health Maintenance Discussed  immunizations including flu, COVID, hepatitis B, and shingles vaccines. He received a flu shot but declined COVID vaccine due to adverse reactions. Discussed hepatitis B booster and shingles vaccine, noting potential side effects and insurance coverage considerations. He is open to receiving necessary vaccines but prefers to avoid those with significant side effects. Preventive health care reviewed.  Counseling on healthy lifestyle including diet and exercise.  Recommend regular dental and eye exams.  Immunizations reviewed.   - Consider hepatitis B booster at a later date. - Discuss shingles vaccine with insurance for coverage and plan to receive it after age 33.     No follow-ups on file.     Boby Mackintosh, NP-C      [1]  No outpatient medications prior to visit.   No facility-administered medications prior to visit.   "

## 2024-02-21 NOTE — Patient Instructions (Signed)

## 2024-03-18 ENCOUNTER — Encounter: Payer: Self-pay | Admitting: Nurse Practitioner

## 2024-04-13 ENCOUNTER — Ambulatory Visit: Admitting: Nurse Practitioner
